# Patient Record
Sex: Male | Born: 2000 | Race: Black or African American | Hispanic: No | Marital: Single | Smoking: Never smoker
Health system: Southern US, Community
[De-identification: ages and names within clinical notes are randomized; demographics above are authoritative.]

## PROBLEM LIST (undated history)

## (undated) DIAGNOSIS — J302 Other seasonal allergic rhinitis: Secondary | ICD-10-CM

## (undated) HISTORY — DX: Other seasonal allergic rhinitis: J30.2

---
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
## 2018-09-11 IMAGING — CT CT RENAL STONE PROTOCOL
2 of 4 series · 17 of 46 positions shown, 19 images · non-contrast
Comparison: None.

CLINICAL DATA: Onset of left side abdominal pain this morning.
Hematuria.

EXAM:
CT ABDOMEN AND PELVIS WITHOUT CONTRAST
TECHNIQUE: Multidetector CT imaging of the abdomen and pelvis was performed
following the standard protocol without IV contrast.

[Series 3: stone study 5.0 i30f 2 · axial · 0.68mm/px · z∈[-1020,-640]mm · 14 of 84 slices shown, 16 images]
[im 4/84  soft-tissue]
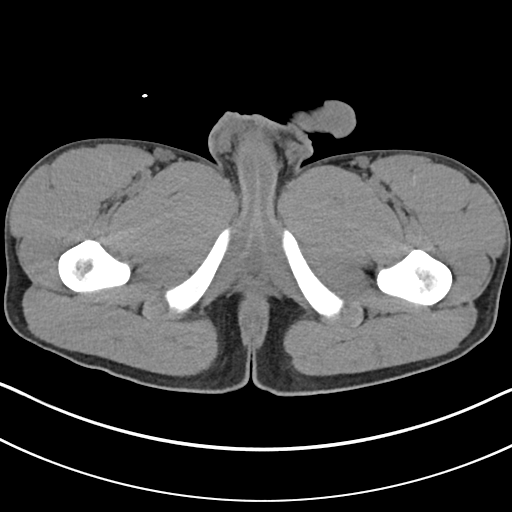
[im 4/84  bone]
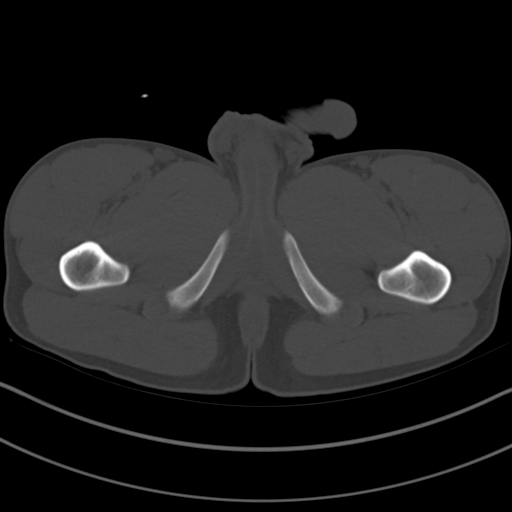
[im 11/84  soft-tissue]
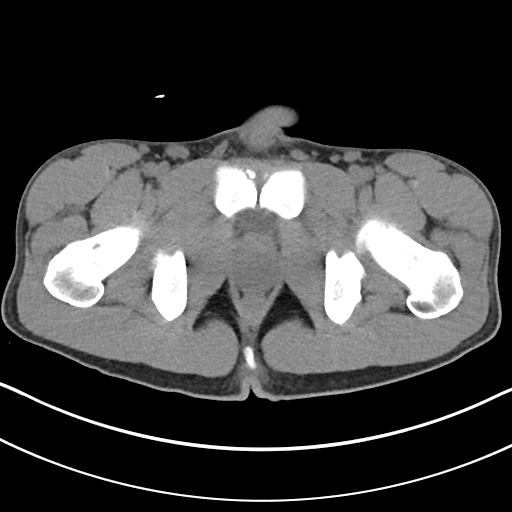
[im 18/84  soft-tissue]
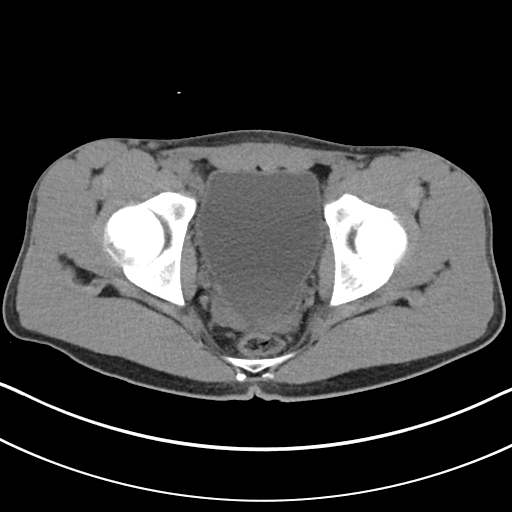
[im 21/84  soft-tissue]
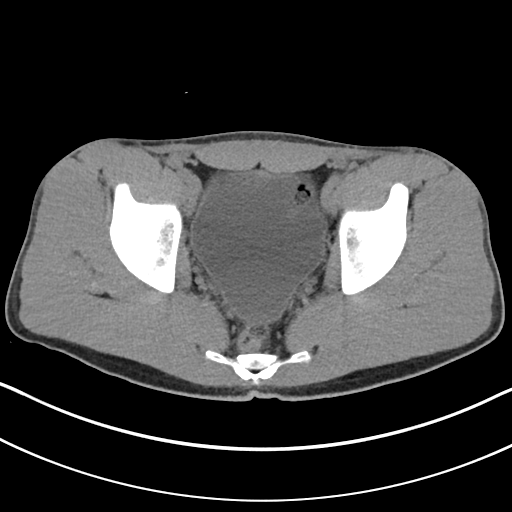
[im 28/84  soft-tissue]
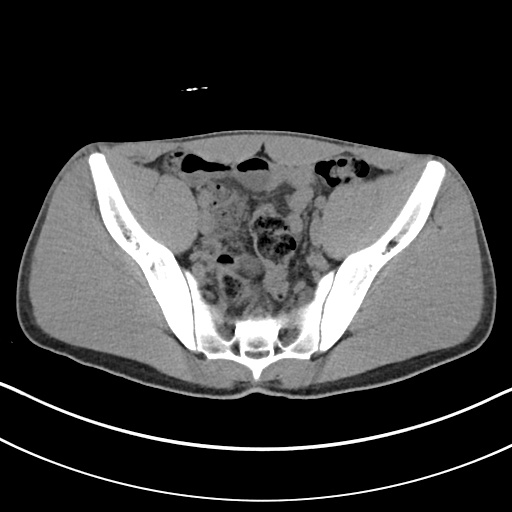
[im 35/84  soft-tissue]
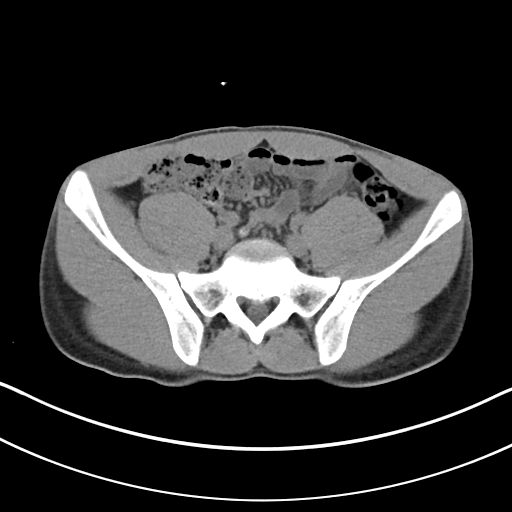
[im 39/84  soft-tissue]
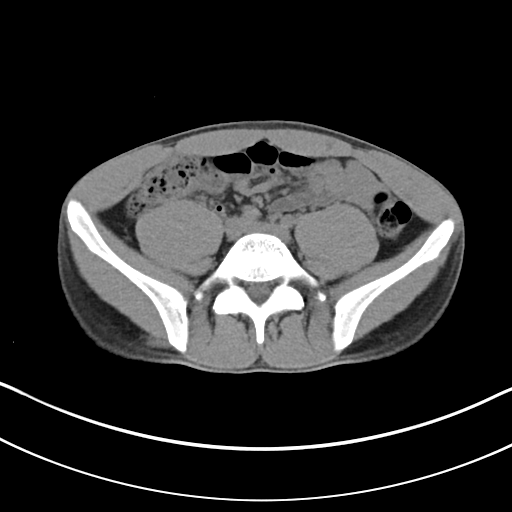
[im 45/84  soft-tissue]
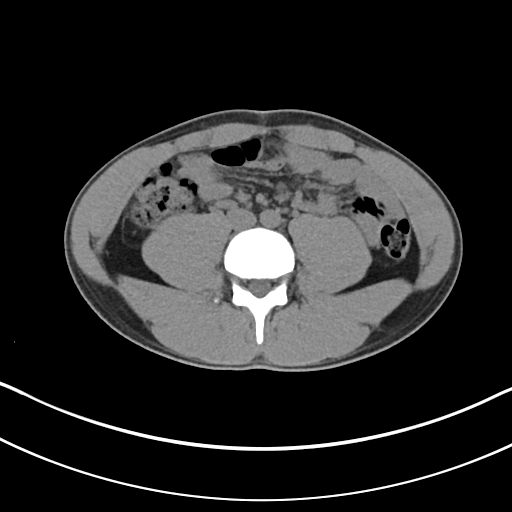
[im 49/84  soft-tissue]
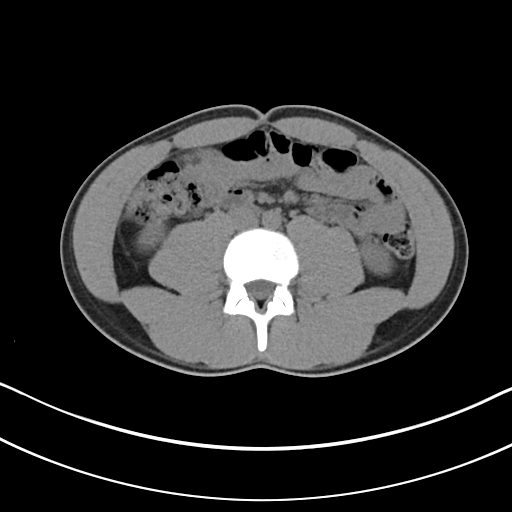
[im 49/84  bone]
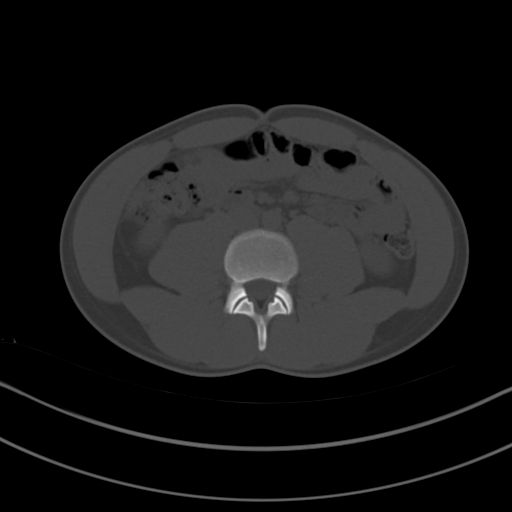
[im 56/84  soft-tissue]
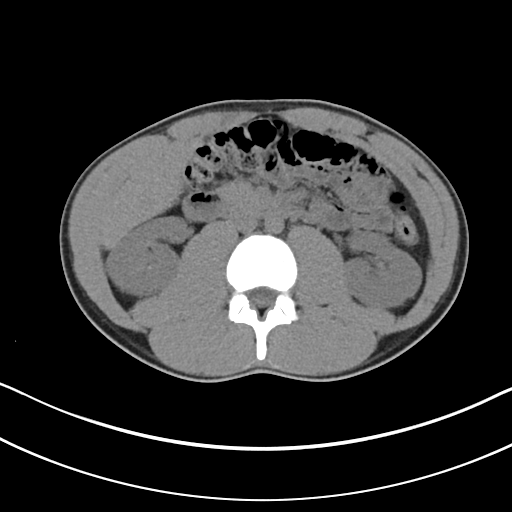
[im 63/84  soft-tissue]
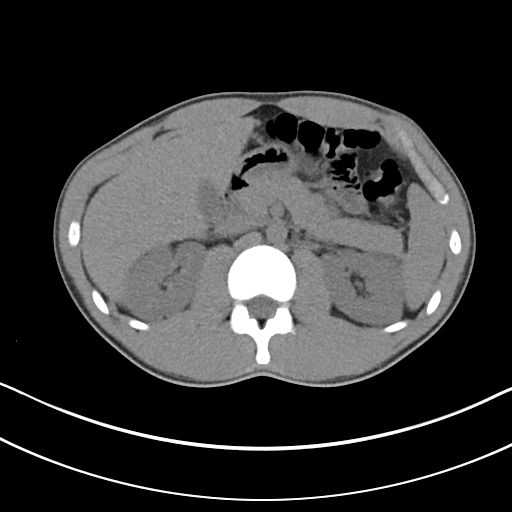
[im 66/84  soft-tissue]
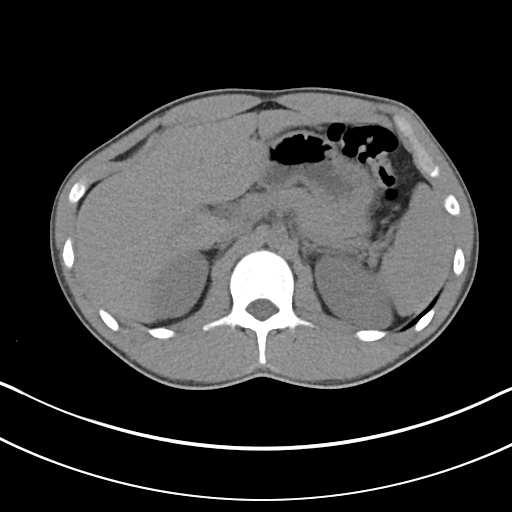
[im 73/84  soft-tissue]
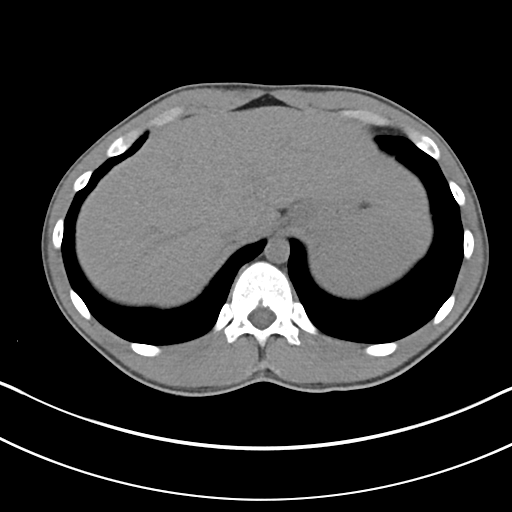
[im 80/84  soft-tissue]
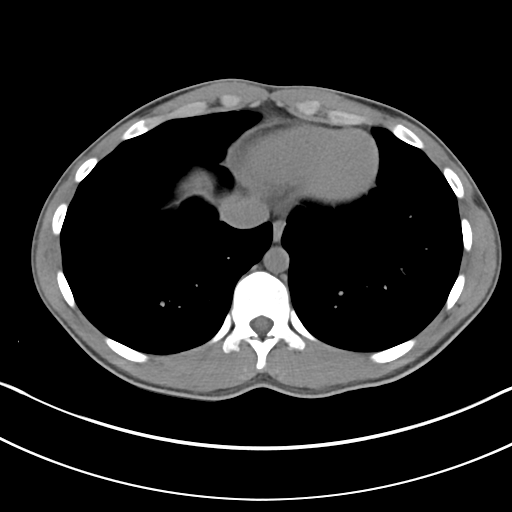

[Series 6: coronal soft tissue · coronal · 0.73mm/px · 3 of 70 slices shown]
[im 24/70  soft-tissue]
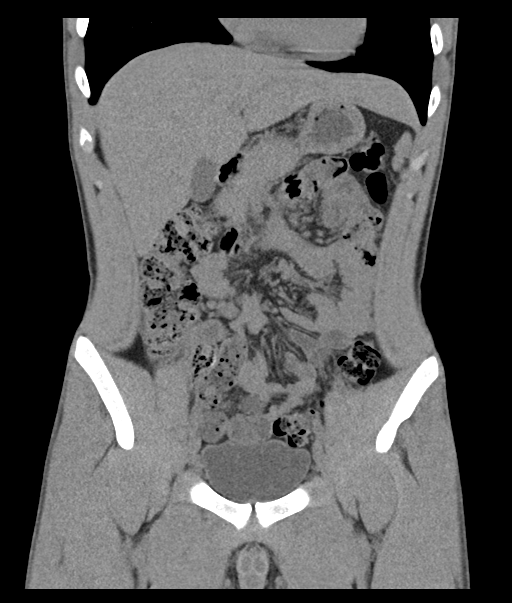
[im 31/70  soft-tissue]
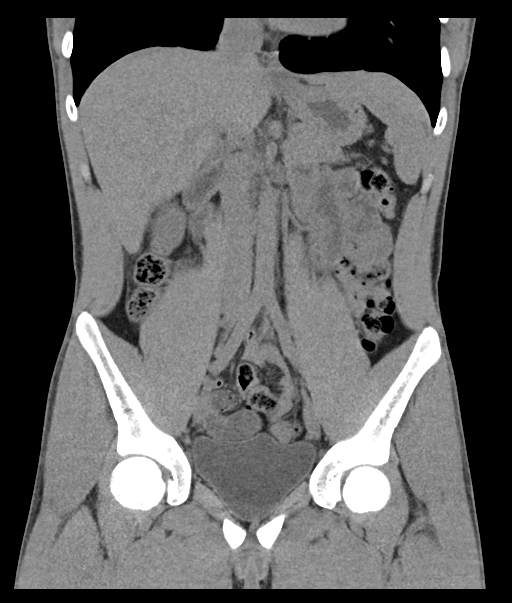
[im 39/70  soft-tissue]
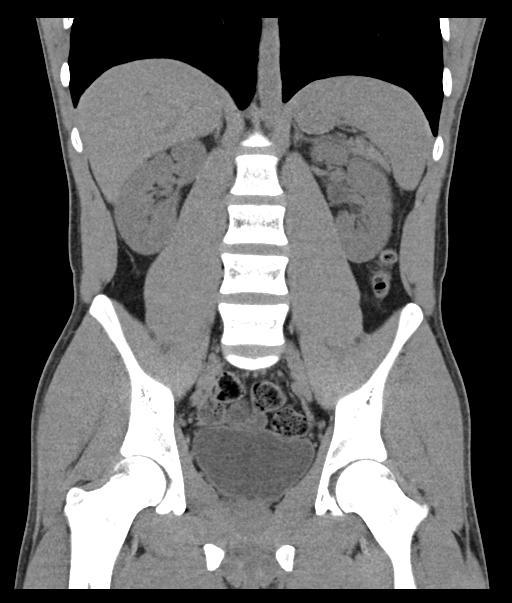

[17 of 46 positions shown; findings below may reference images not displayed]

FINDINGS: Lower chest: Clear.  No pleural or pericardial effusion.

Hepatobiliary: No focal liver abnormality is seen. No gallstones,
gallbladder wall thickening, or biliary dilatation.

Pancreas: Unremarkable. No pancreatic ductal dilatation or
surrounding inflammatory changes.

Spleen: Normal in size without focal abnormality.

Adrenals/Urinary Tract: Adrenal glands are unremarkable. Kidneys are
normal, without renal calculi, focal lesion, or hydronephrosis.
Bladder is unremarkable.

Stomach/Bowel: Stomach is within normal limits. Appendix appears
normal. No evidence of bowel wall thickening, distention, or
inflammatory changes.

Vascular/Lymphatic: No significant vascular findings are present. No
enlarged abdominal or pelvic lymph nodes.

Reproductive: Prostate is unremarkable.

Other: No abdominal wall hernia or abnormality. No abdominopelvic
ascites.

Musculoskeletal: Negative.
IMPRESSION: Normal CT abdomen and pelvis.  Negative for urinary tract stone.
